# Patient Record
Sex: Female | Born: 1970 | Race: Black or African American | Hispanic: No | Marital: Single | Smoking: Never smoker
Health system: Southern US, Community
[De-identification: ages and names within clinical notes are randomized; demographics above are authoritative.]

## PROBLEM LIST (undated history)

## (undated) DIAGNOSIS — E119 Type 2 diabetes mellitus without complications: Secondary | ICD-10-CM

## (undated) DIAGNOSIS — K219 Gastro-esophageal reflux disease without esophagitis: Secondary | ICD-10-CM

## (undated) DIAGNOSIS — I10 Essential (primary) hypertension: Secondary | ICD-10-CM

## (undated) HISTORY — PX: ABDOMINAL HYSTERECTOMY: SHX81

---

## 2009-03-13 ENCOUNTER — Emergency Department (HOSPITAL_BASED_OUTPATIENT_CLINIC_OR_DEPARTMENT_OTHER): Admission: EM | Admit: 2009-03-13 | Discharge: 2009-03-13 | Payer: Self-pay | Admitting: Emergency Medicine

## 2010-07-01 ENCOUNTER — Emergency Department (HOSPITAL_BASED_OUTPATIENT_CLINIC_OR_DEPARTMENT_OTHER): Admission: EM | Admit: 2010-07-01 | Discharge: 2010-07-01 | Payer: Self-pay | Admitting: Emergency Medicine

## 2011-05-04 ENCOUNTER — Emergency Department (HOSPITAL_BASED_OUTPATIENT_CLINIC_OR_DEPARTMENT_OTHER)
Admission: EM | Admit: 2011-05-04 | Discharge: 2011-05-04 | Disposition: A | Discharge status: Home / Self Care | Payer: 59 | Attending: Emergency Medicine | Admitting: Emergency Medicine

## 2011-05-04 DIAGNOSIS — L259 Unspecified contact dermatitis, unspecified cause: Secondary | ICD-10-CM | POA: Insufficient documentation

## 2011-05-04 DIAGNOSIS — I10 Essential (primary) hypertension: Secondary | ICD-10-CM | POA: Insufficient documentation

## 2011-05-04 LAB — GLUCOSE, CAPILLARY: Glucose-Capillary: 110 mg/dL — ABNORMAL HIGH (ref 70–99)

## 2011-09-22 ENCOUNTER — Emergency Department (INDEPENDENT_AMBULATORY_CARE_PROVIDER_SITE_OTHER): Payer: Worker's Compensation

## 2011-09-22 ENCOUNTER — Emergency Department (HOSPITAL_BASED_OUTPATIENT_CLINIC_OR_DEPARTMENT_OTHER)
Admission: EM | Admit: 2011-09-22 | Discharge: 2011-09-22 | Disposition: A | Discharge status: Home / Self Care | Payer: Worker's Compensation | Attending: Emergency Medicine | Admitting: Emergency Medicine

## 2011-09-22 ENCOUNTER — Encounter: Payer: Self-pay | Admitting: *Deleted

## 2011-09-22 DIAGNOSIS — IMO0002 Reserved for concepts with insufficient information to code with codable children: Secondary | ICD-10-CM | POA: Insufficient documentation

## 2011-09-22 DIAGNOSIS — X58XXXA Exposure to other specified factors, initial encounter: Secondary | ICD-10-CM

## 2011-09-22 DIAGNOSIS — X500XXA Overexertion from strenuous movement or load, initial encounter: Secondary | ICD-10-CM | POA: Insufficient documentation

## 2011-09-22 DIAGNOSIS — Y9289 Other specified places as the place of occurrence of the external cause: Secondary | ICD-10-CM | POA: Insufficient documentation

## 2011-09-22 DIAGNOSIS — S76119A Strain of unspecified quadriceps muscle, fascia and tendon, initial encounter: Secondary | ICD-10-CM

## 2011-09-22 DIAGNOSIS — M25569 Pain in unspecified knee: Secondary | ICD-10-CM

## 2011-09-22 HISTORY — DX: Gastro-esophageal reflux disease without esophagitis: K21.9

## 2011-09-22 MED ORDER — NAPROXEN 250 MG PO TABS
500.0000 mg | ORAL_TABLET | Freq: Once | ORAL | Status: AC
  Administered 2011-09-22: 500 mg via ORAL
  Filled 2011-09-22: qty 2

## 2011-09-22 MED ORDER — NAPROXEN SODIUM 275 MG PO TABS: 275.0000 mg | ORAL_TABLET | Freq: Two times a day (BID) | ORAL | Status: AC | PRN

## 2011-09-22 NOTE — ED Notes (Signed)
C/o left knee pain after twisting it at work

## 2011-09-22 NOTE — ED Notes (Signed)
UDS for work collected.

## 2011-09-22 NOTE — ED Notes (Signed)
rec'd call from pt requesting crutches for additional support due to the weakness in her knees. Spoke to Dr [employer], verbal order rec'd for crutches, pt states she knows how to use them. Crutches placed at front desk for pt to pick up.

## 2011-09-22 NOTE — ED Provider Notes (Signed)
History     CSN: [id number] Arrival date & time: No admission date for patient encounter.  Chief Complaint  Patient presents with  . Knee Pain    (Consider location/radiation/quality/duration/timing/severity/associated sxs/prior treatment) HPI This is a 40 year old black female who twisted her left knee at work this morning. She is now having mild to moderate pain just above the left patella when she ambulates. There is minimal pain at rest. There is no deformity or swelling. She denies other injury. The pain is worst on full extension of the left knee.  Past Medical History  Diagnosis Date  . GERD (gastroesophageal reflux disease)     Past Surgical History  Procedure Date  . Abdominal hysterectomy     History reviewed. No pertinent family history.  History  Substance Use Topics  . Smoking status: Never Smoker   . Smokeless tobacco: Not on file  . Alcohol Use: Yes    OB History    Grav Para Term Preterm Abortions TAB SAB Ect Mult Living                  Review of Systems  All other systems reviewed and are negative.    Allergies  Penicillins  Home Medications   Current Outpatient Rx  Name Route Sig Dispense Refill  . OMEPRAZOLE 10 MG PO CPDR Oral Take 10 mg by mouth daily.        BP 155/81  Pulse 91  Temp(Src) 98.2 F (36.8 C) (Oral)  Resp 18  Ht 5\' 7"  (1.702 m)  Wt 240 lb (108.863 kg)  BMI 37.59 kg/m2  SpO2 98%  Physical Exam General: Well-developed, well-nourished female in no acute distress; appearance consistent with age of record HENT: normocephalic, atraumatic Eyes: Normal appearance Neck: supple Heart: regular rate and rhythm Lungs: Normal respiratory effort and excursion Abdomen: soft; not as Extremities: No deformity; full range of motion; mild tenderness on insertion of left quadriceps tendon on the left patella without swelling or deformity Neurologic: Awake, alert and oriented;motor function intact in all extremities and symmetric;  no facial droop Skin: Warm and dry Psychiatric: Normal mood and affect    ED Course  Procedures (including critical care time)     MDM           Hanley Seamen, MD 09/22/11 [phone]

## 2013-02-17 IMAGING — CR DG KNEE COMPLETE 4+V*L*
4 series · 4 of 4 positions shown · non-contrast
Comparison: None.

CLINICAL DATA: Left knee pain.

LEFT KNEE - COMPLETE 4+ VIEW

[t knee ap left]
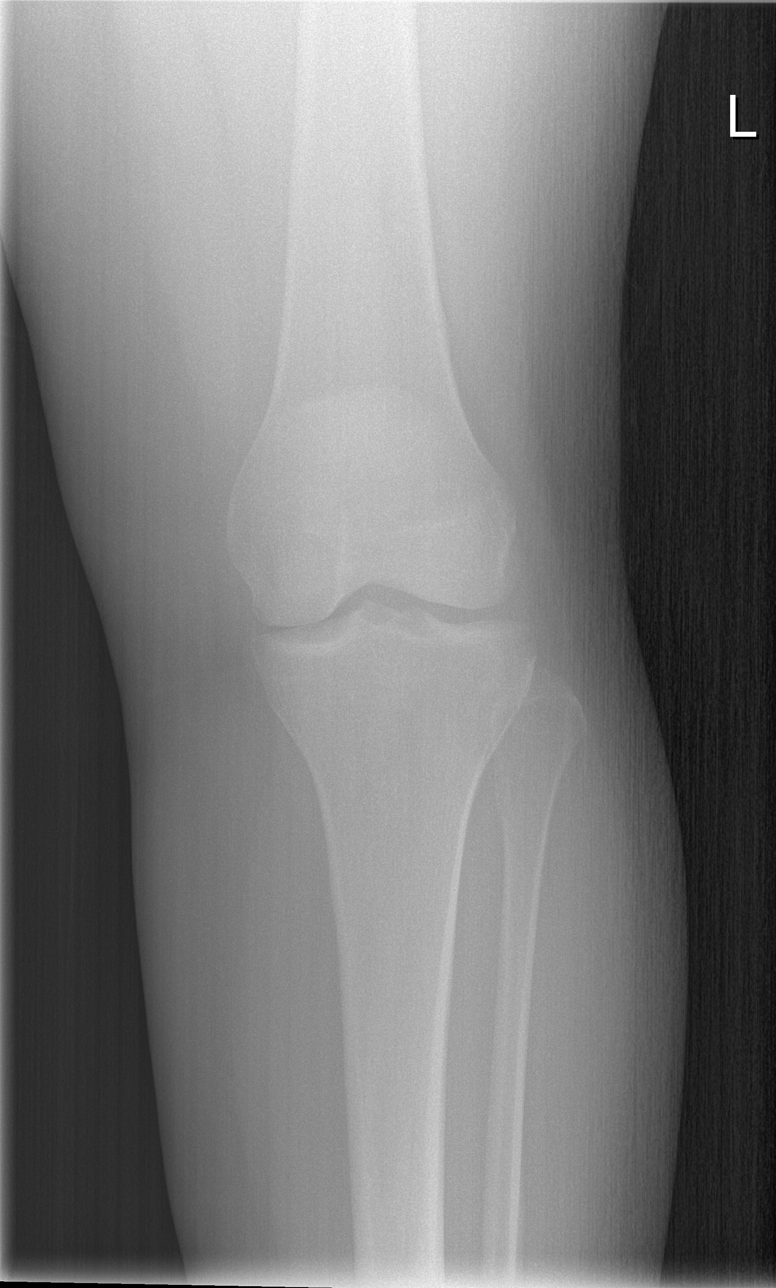

[t knee oblique left (1 of 2)]
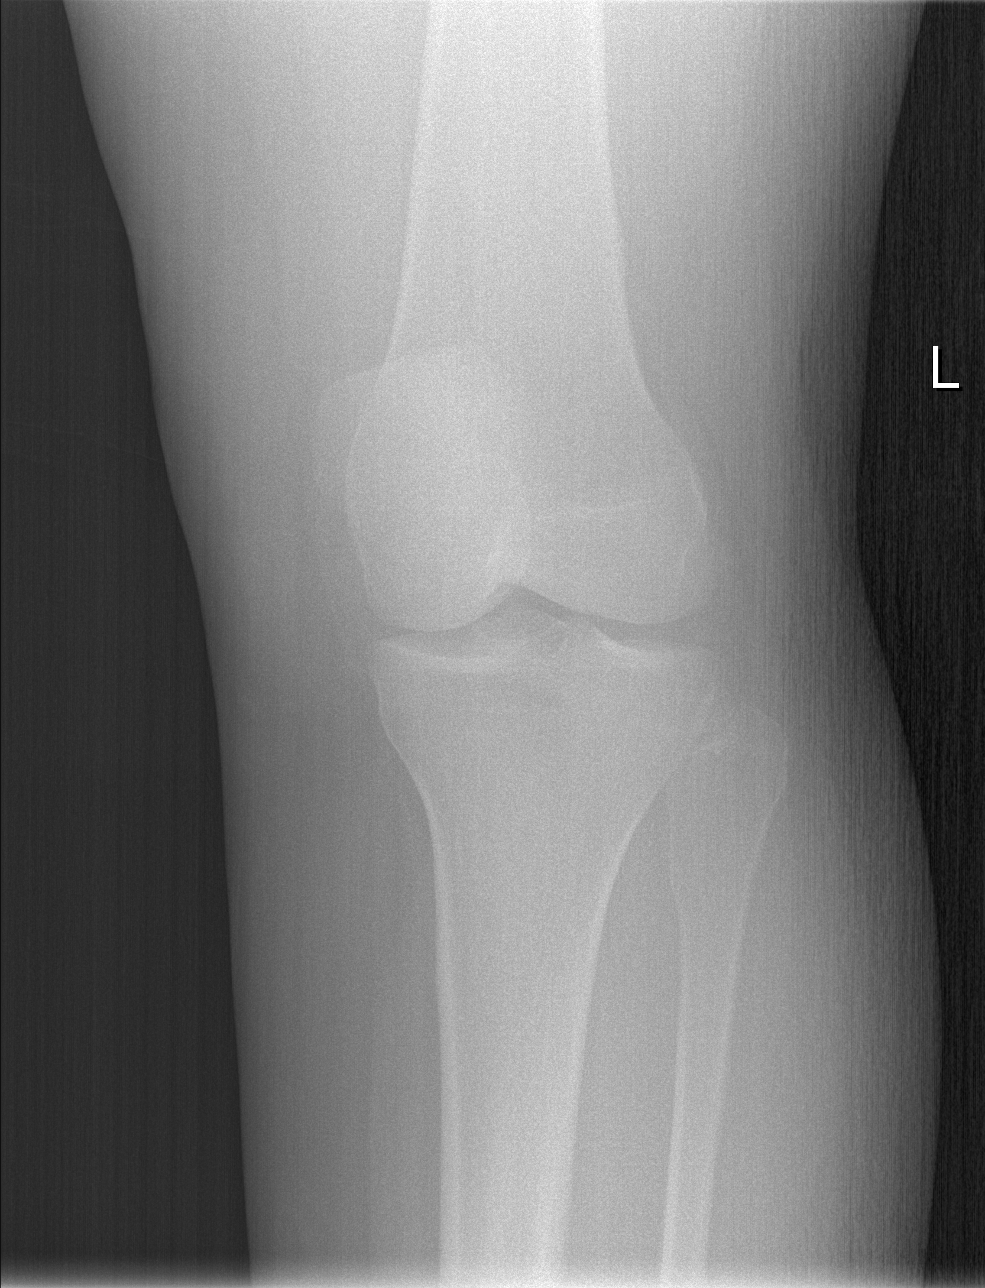

[t knee oblique left (2 of 2)]
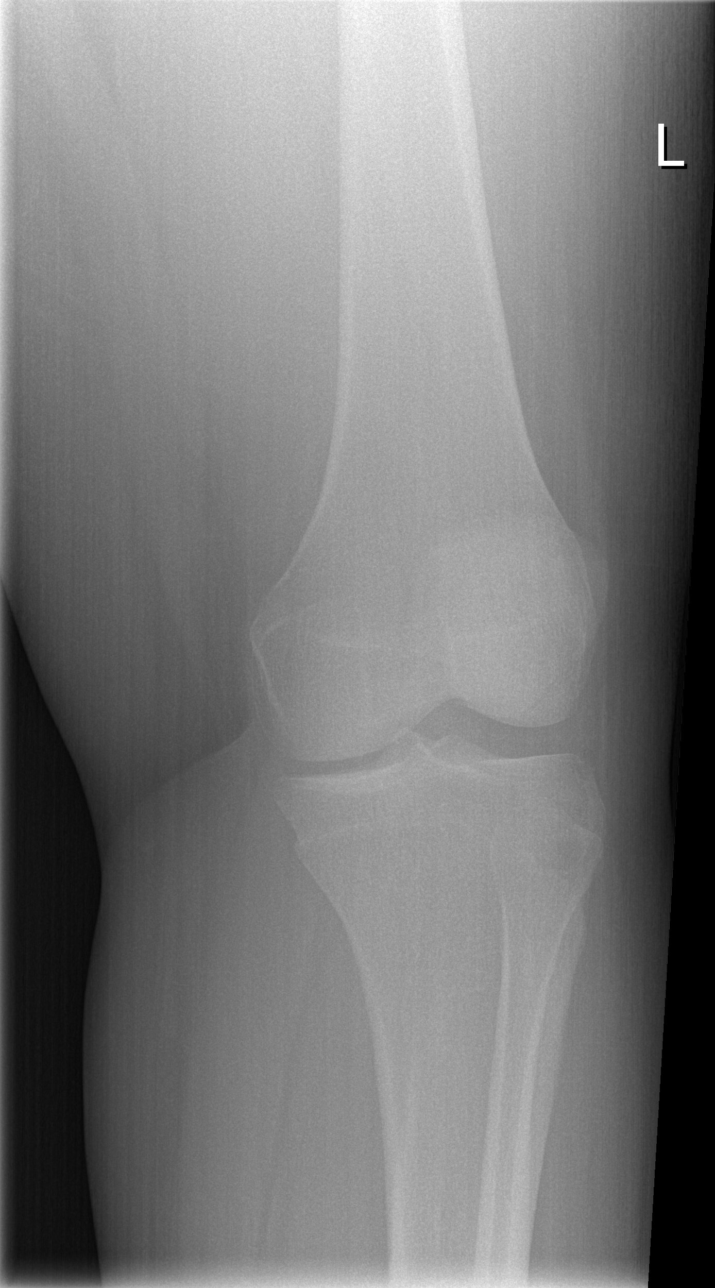

[t knee lat left]
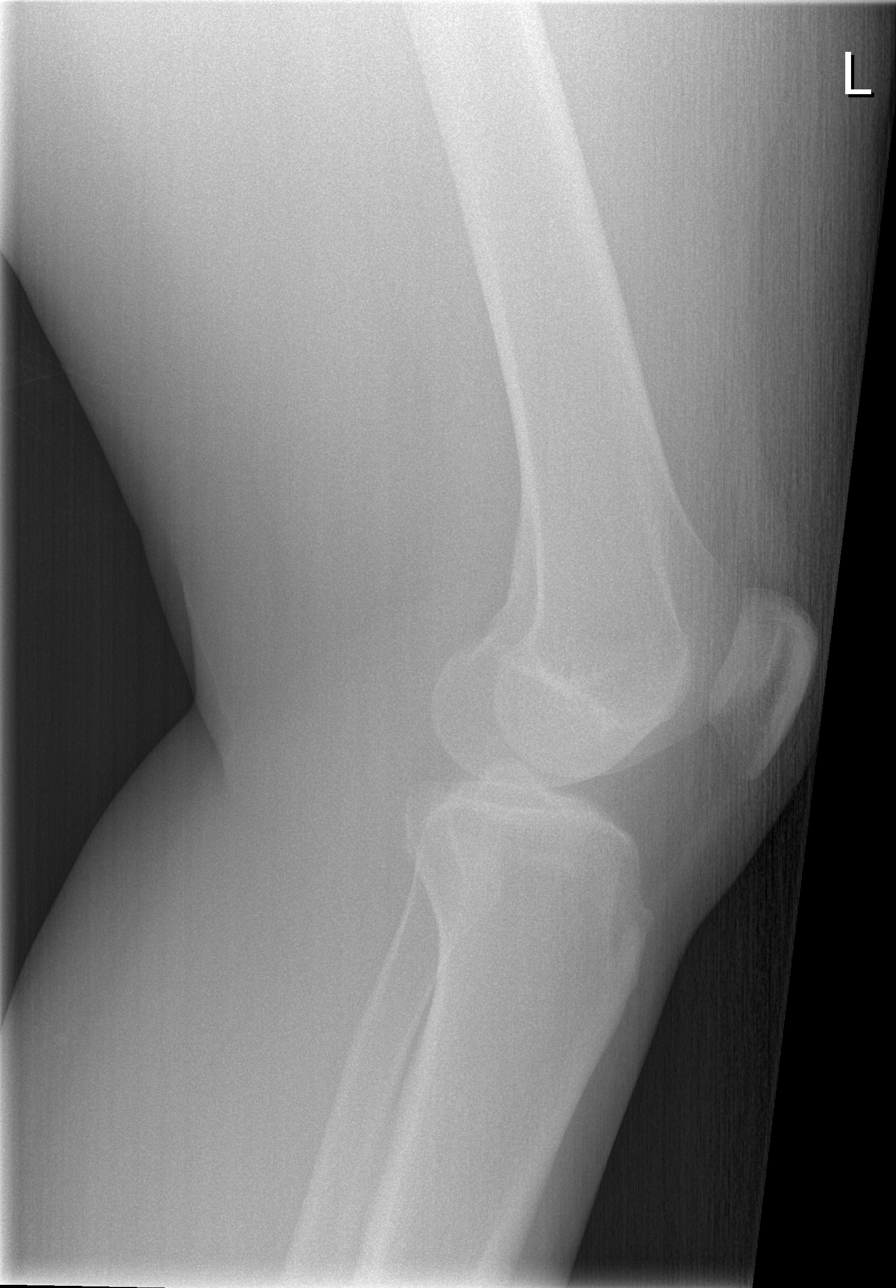

[4 of 4 positions shown; findings below may reference images not displayed]

FINDINGS: No displaced acute fracture or dislocation identified. No
aggressive appearing osseous lesion.
IMPRESSION: No acute osseous abnormality identified. If clinical concern for a
fracture persists, recommend a repeat radiograph in 5-10 days to
evaluate for interval change or callus formation.

## 2017-11-18 ENCOUNTER — Other Ambulatory Visit: Payer: Self-pay | Admitting: Family Medicine

## 2018-02-08 ENCOUNTER — Other Ambulatory Visit: Payer: Self-pay | Admitting: Family Medicine

## 2018-08-21 ENCOUNTER — Other Ambulatory Visit: Payer: Self-pay

## 2018-08-21 ENCOUNTER — Encounter (HOSPITAL_BASED_OUTPATIENT_CLINIC_OR_DEPARTMENT_OTHER): Payer: Self-pay

## 2018-08-21 DIAGNOSIS — R600 Localized edema: Secondary | ICD-10-CM | POA: Insufficient documentation

## 2018-08-21 DIAGNOSIS — E876 Hypokalemia: Secondary | ICD-10-CM | POA: Diagnosis not present

## 2018-08-21 DIAGNOSIS — R2243 Localized swelling, mass and lump, lower limb, bilateral: Secondary | ICD-10-CM | POA: Diagnosis present

## 2018-08-21 DIAGNOSIS — L03115 Cellulitis of right lower limb: Secondary | ICD-10-CM | POA: Insufficient documentation

## 2018-08-21 DIAGNOSIS — I10 Essential (primary) hypertension: Secondary | ICD-10-CM | POA: Insufficient documentation

## 2018-08-21 DIAGNOSIS — L03116 Cellulitis of left lower limb: Secondary | ICD-10-CM | POA: Insufficient documentation

## 2018-08-21 DIAGNOSIS — Z79899 Other long term (current) drug therapy: Secondary | ICD-10-CM | POA: Insufficient documentation

## 2018-08-21 DIAGNOSIS — E119 Type 2 diabetes mellitus without complications: Secondary | ICD-10-CM | POA: Diagnosis not present

## 2018-08-21 DIAGNOSIS — Z7984 Long term (current) use of oral hypoglycemic drugs: Secondary | ICD-10-CM | POA: Insufficient documentation

## 2018-08-21 NOTE — ED Triage Notes (Addendum)
Bilateral lower leg pain since Saturday, with slight redness, painful to touch, pain runs up her shins, also c/o bilateral lower extremity swelling at the end of triage

## 2018-08-22 ENCOUNTER — Ambulatory Visit (HOSPITAL_BASED_OUTPATIENT_CLINIC_OR_DEPARTMENT_OTHER)
Admit: 2018-08-22 | Discharge: 2018-08-22 | Disposition: A | Discharge status: Home / Self Care | Payer: Managed Care, Other (non HMO) | Attending: Emergency Medicine | Admitting: Emergency Medicine

## 2018-08-22 ENCOUNTER — Emergency Department (HOSPITAL_BASED_OUTPATIENT_CLINIC_OR_DEPARTMENT_OTHER)
Admission: EM | Admit: 2018-08-22 | Discharge: 2018-08-22 | Disposition: A | Discharge status: Home / Self Care | Payer: Managed Care, Other (non HMO) | Attending: Emergency Medicine | Admitting: Emergency Medicine

## 2018-08-22 DIAGNOSIS — I82403 Acute embolism and thrombosis of unspecified deep veins of lower extremity, bilateral: Secondary | ICD-10-CM

## 2018-08-22 DIAGNOSIS — E876 Hypokalemia: Secondary | ICD-10-CM

## 2018-08-22 DIAGNOSIS — L03119 Cellulitis of unspecified part of limb: Secondary | ICD-10-CM

## 2018-08-22 DIAGNOSIS — R600 Localized edema: Secondary | ICD-10-CM

## 2018-08-22 HISTORY — DX: Type 2 diabetes mellitus without complications: E11.9

## 2018-08-22 HISTORY — DX: Essential (primary) hypertension: I10

## 2018-08-22 LAB — BASIC METABOLIC PANEL
Anion gap: 9 (ref 5–15)
BUN: 15 mg/dL (ref 6–20)
CO2: 28 mmol/L (ref 22–32)
CREATININE: 0.78 mg/dL (ref 0.44–1.00)
Calcium: 8.9 mg/dL (ref 8.9–10.3)
Chloride: 99 mmol/L (ref 98–111)
GFR calc Af Amer: >60 mL/min (ref >60)
GLUCOSE: 90 mg/dL (ref 70–99)
Potassium: 3 mmol/L — ABNORMAL LOW (ref 3.5–5.1)
Sodium: 136 mmol/L (ref 135–145)

## 2018-08-22 LAB — CBC WITH DIFFERENTIAL/PLATELET
BASOS PCT: 0 %
Basophils Absolute: 0 10*3/uL (ref 0.0–0.1)
EOS ABS: 0.1 10*3/uL (ref 0.0–0.7)
EOS PCT: 1 %
HCT: 36.7 % (ref 36.0–46.0)
Hemoglobin: 12.5 g/dL (ref 12.0–15.0)
Lymphocytes Relative: 56 %
Lymphs Abs: 2.6 10*3/uL (ref 0.7–4.0)
MCH: 28.6 pg (ref 26.0–34.0)
MCHC: 34.1 g/dL (ref 30.0–36.0)
MCV: 84 fL (ref 78.0–100.0)
MONOS PCT: 12 %
Monocytes Absolute: 0.6 10*3/uL (ref 0.1–1.0)
NEUTROS PCT: 31 %
Neutro Abs: 1.5 10*3/uL (ref 1.7–7.7)
PLATELETS: 195 10*3/uL (ref 150–400)
RBC: 4.37 MIL/uL (ref 3.87–5.11)
RDW: 14 % (ref 11.5–15.5)
WBC: 4.8 10*3/uL (ref 4.0–10.5)

## 2018-08-22 MED ORDER — POTASSIUM CHLORIDE CRYS ER 20 MEQ PO TBCR: 40.0000 meq | EXTENDED_RELEASE_TABLET | Freq: Every day | ORAL | 0 refills | Status: DC

## 2018-08-22 MED ORDER — DOXYCYCLINE HYCLATE 100 MG PO CAPS: 100.0000 mg | ORAL_CAPSULE | Freq: Two times a day (BID) | ORAL | 0 refills | Status: AC

## 2018-08-22 MED ORDER — POTASSIUM CHLORIDE CRYS ER 20 MEQ PO TBCR: 40.0000 meq | EXTENDED_RELEASE_TABLET | Freq: Every day | ORAL | 0 refills | Status: AC

## 2018-08-22 MED ORDER — POTASSIUM CHLORIDE CRYS ER 20 MEQ PO TBCR
40.0000 meq | EXTENDED_RELEASE_TABLET | Freq: Once | ORAL | Status: AC
  Administered 2018-08-22: 40 meq via ORAL
  Filled 2018-08-22: qty 2

## 2018-08-22 NOTE — Discharge Instructions (Addendum)
You were seen today for lower extremity swelling.  You may have some mild cellulitis.  Take antibiotics as prescribed.  Your potassium was noted to be low.  Take supplementation and follow-up with your primary doctor for recheck.  Return later today for ultrasound.  In the meantime make sure to keep legs elevated.

## 2018-08-22 NOTE — ED Provider Notes (Signed)
Patient presented to the radiology department today for DVT study as an extension of their workup for left knee pain last night. Please see previous provider's note for details of that visit to include history, physical and medical decision making.   I only relayed the results of the study to them which showed a bakers cyst but negative for DVT.   Also discussed with them reasons to follow up at the emergency department otherwise continue following up with her primary doctor as directed by previous provider.    Marily Memos, MD 08/23/18 2036

## 2018-08-22 NOTE — ED Provider Notes (Signed)
[facility] EMERGENCY DEPARTMENT Provider Note   CSN: [id number] Arrival date & time: 08/21/18  2251     History   Chief Complaint Chief Complaint  Patient presents with  . Leg Pain    HPI Jasmine Mathews is a 47 y.o. female.  HPI  This is a 46 year old female with history of diabetes and hypertension who presents with 2 to 3-day history of bilateral lower extremity pain.  Patient states that she noted some swelling and mild redness on Saturday.  She reports is painful to touch.  Rates her pain at 5 out of 10.  She has not taken anything for pain.  Notes swelling is worse at the end of the day.  Has not noted any asymmetric swelling.  No history of blood clots, recent hospitalization, recent long travel.  Patient does not wear compression stockings or take diuretics.  Denies any chest pain, shortness of breath, abdominal pain, nausea, vomiting.  No recent changes in medications.  She denies fevers.  Past Medical History:  Diagnosis Date  . Diabetes mellitus without complication (HCC)   . GERD (gastroesophageal reflux disease)   . Hypertension     There are no active problems to display for this patient.   Past Surgical History:  Procedure Laterality Date  . ABDOMINAL HYSTERECTOMY       OB History   None      Home Medications    Prior to Admission medications   Medication Sig Start Date End Date Taking? Authorizing Provider  enalapril (VASOTEC) 2.5 MG tablet Take 2.5 mg by mouth daily.   Yes [provider]  hydrochlorothiazide (MICROZIDE) 12.5 MG capsule Take 12.5 mg by mouth daily.   Yes [provider]  metFORMIN (GLUCOPHAGE) 500 MG tablet Take by mouth 2 (two) times daily with a meal.   Yes [provider]  Progesterone Micronized (PROGESTERONE PO) Take by mouth.   Yes [provider]  doxycycline (VIBRAMYCIN) 100 MG capsule Take 1 capsule (100 mg total) by mouth 2 (two) times daily. 08/22/18   Horton, Mayer Masker, MD    omeprazole (PRILOSEC) 20 MG capsule TAKE 1 CAPSULE ONCE DAILY AS NEEDED FOR REFLUX SYMPTOMS 02/08/18   Mliss Sax, MD  potassium chloride SA (K-DUR,KLOR-CON) 20 MEQ tablet Take 2 tablets (40 mEq total) by mouth daily. 08/22/18   Horton, Mayer Masker, MD    Family History No family history on file.  Social History Social History   Tobacco Use  . Smoking status: Never Smoker  Substance Use Topics  . Alcohol use: Yes  . Drug use: No     Allergies   Penicillins   Review of Systems Review of Systems  Constitutional: Negative for fever.  Respiratory: Negative for shortness of breath.   Cardiovascular: Positive for leg swelling. Negative for chest pain.  Gastrointestinal: Negative for abdominal pain, nausea and vomiting.  Genitourinary: Negative for dysuria.  Skin: Positive for color change.  All other systems reviewed and are negative.    Physical Exam Updated Vital Signs BP 138/81 (BP Location: Left Arm)   Pulse 65   Temp 97.8 F (36.6 C) (Oral)   Resp 18   Ht 1.702 m (5\' 7" )   Wt 108.9 kg   SpO2 100%   BMI 37.59 kg/m   Physical Exam  Constitutional: She is oriented to person, place, and time. She appears well-developed and well-nourished. No distress.  Overweight  HENT:  Head: Normocephalic and atraumatic.  Eyes: Pupils are equal, round, and  reactive to light.  Neck: Neck supple.  Cardiovascular: Normal rate, regular rhythm and normal heart sounds.  Pulmonary/Chest: Effort normal and breath sounds normal. No respiratory distress. She has no wheezes.  Abdominal: Soft. Bowel sounds are normal. There is no tenderness.  Musculoskeletal: She exhibits edema.  Trace to 1+ bilateral lower extremity edema, slight anterior discoloration and erythema, slight warmth, no calf tenderness to palpation  Neurological: She is alert and oriented to person, place, and time.  Skin: Skin is warm and dry.  Psychiatric: She has a normal mood and affect.  Nursing note and  vitals reviewed.    ED Treatments / Results  Labs (all labs ordered are listed, but only abnormal results are displayed) Labs Reviewed  BASIC METABOLIC PANEL - Abnormal; Notable for the following components:      Result Value   Potassium 3.0 (*)    All other components within normal limits  CBC WITH DIFFERENTIAL/PLATELET    EKG None  Radiology No results found.  Procedures Procedures (including critical care time)  Medications Ordered in ED Medications  potassium chloride SA (K-DUR,KLOR-CON) CR tablet 40 mEq (has no administration in time range)     Initial Impression / Assessment and Plan / ED Course  I have reviewed the triage vital signs and the nursing notes.  Pertinent labs & imaging results that were available during my care of the patient were reviewed by me and considered in my medical decision making (see chart for details).     She presents with lower extremity swelling.  Overall nontoxic-appearing vital signs reassuring.  Reports she has been afebrile.  Symmetric swelling on exam.  She does have some slight erythema and warmth.  Likely some early cellulitis.  No leukocytosis.  She does have some hypokalemia at 3.0.  This would preclude initiation of any diuretic.  Patient is also concerned about blood clot.  I am less suspicious for this given symmetry of swelling.  However, will have patient return for ultrasound later today.  In the meantime will supplement with potassium and start on doxycycline.  I have encouraged her to keep her legs elevated.  Follow-up with primary doctor in 1 week for recheck potassium.  That time she may need to be assessed for diuretics.  After history, exam, and medical workup I feel the patient has been appropriately medically screened and is safe for discharge home. Pertinent diagnoses were discussed with the patient. Patient was given return precautions.   Final Clinical Impressions(s) / ED Diagnoses   Final diagnoses:  Lower  extremity edema  Cellulitis of lower extremity, unspecified laterality  Hypokalemia    ED Discharge Orders         Ordered    doxycycline (VIBRAMYCIN) 100 MG capsule  2 times daily     08/22/18 0210    potassium chloride SA (K-DUR,KLOR-CON) 20 MEQ tablet  Daily     08/22/18 0210    US Venous Img Lower Bilateral     08/22/18 0210           Shon Baton, MD 08/22/18 [phone]

## 2018-12-15 IMAGING — US US EXTREM LOW VENOUS BILAT
1 series · 13 of 24 positions shown · non-contrast
Comparison: None.

CLINICAL DATA: 46-year-old female with bilateral lower extremity
pain, swelling and redness



[Series 1: us extrem low venous bilat · 0.10mm/px · 13 of 66 slices shown]
[im 1/66]
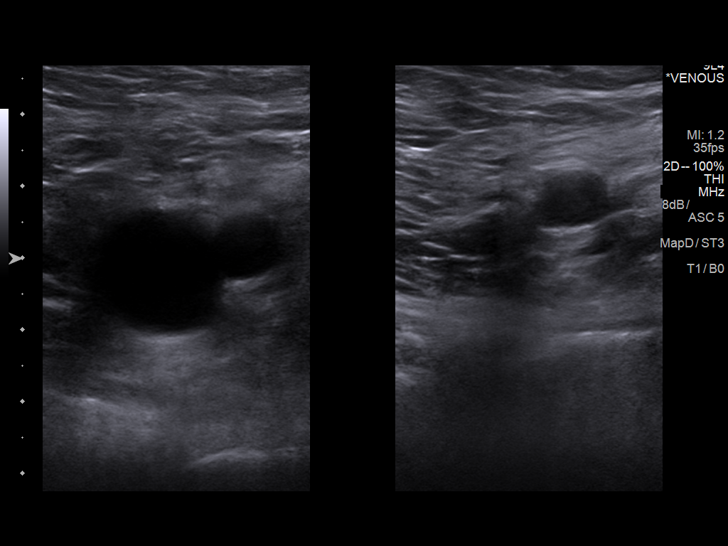
[im 6/66]
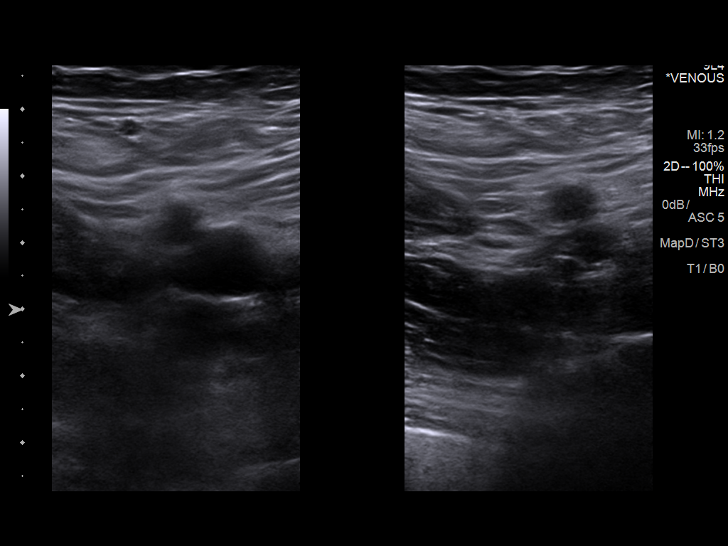
[im 12/66]
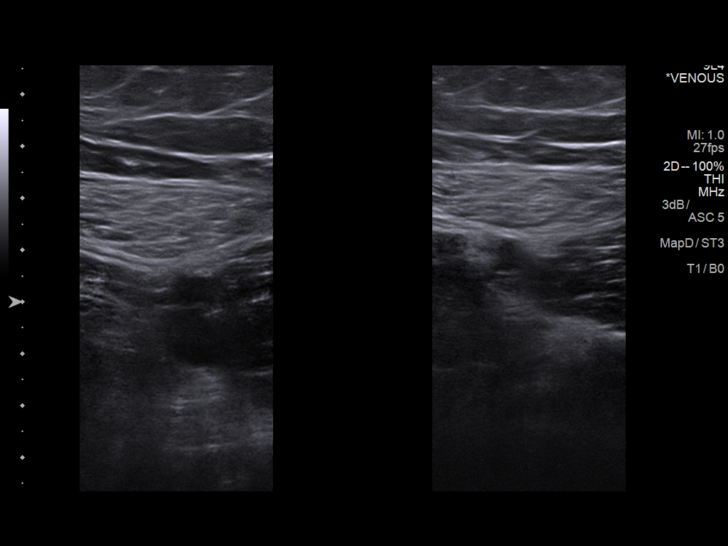
[im 17/66]
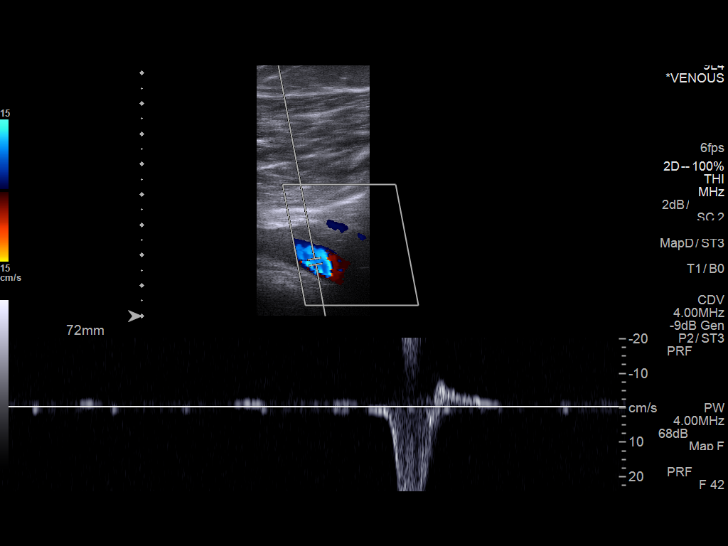
[im 23/66]
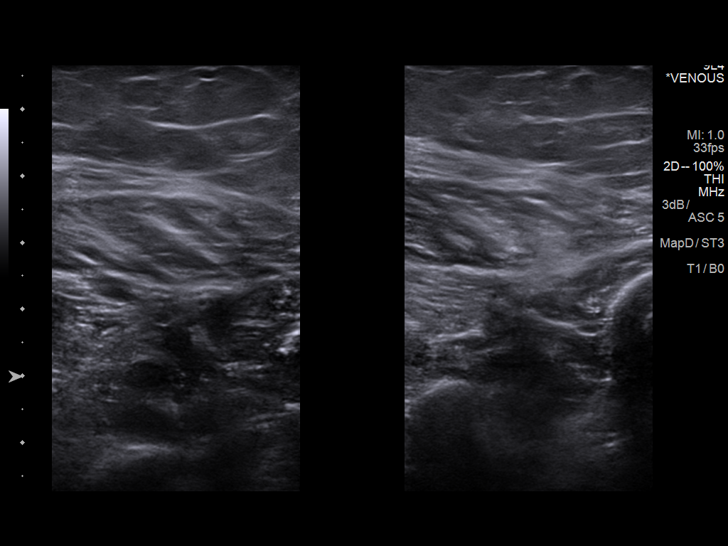
[im 29/66]
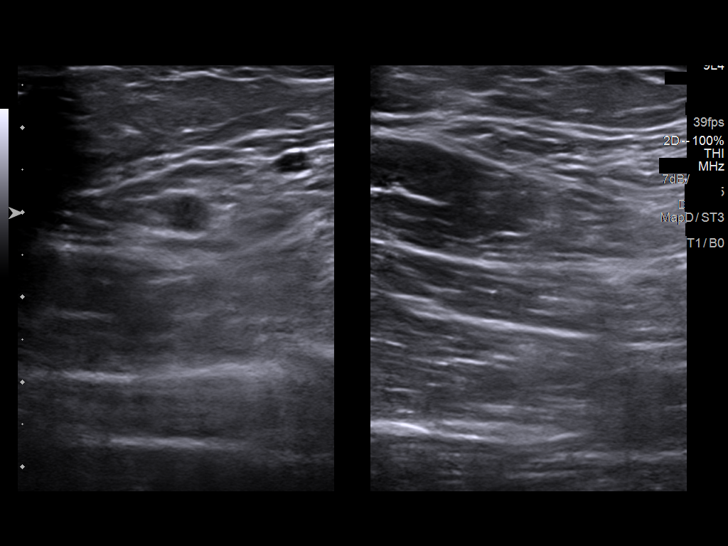
[im 34/66]
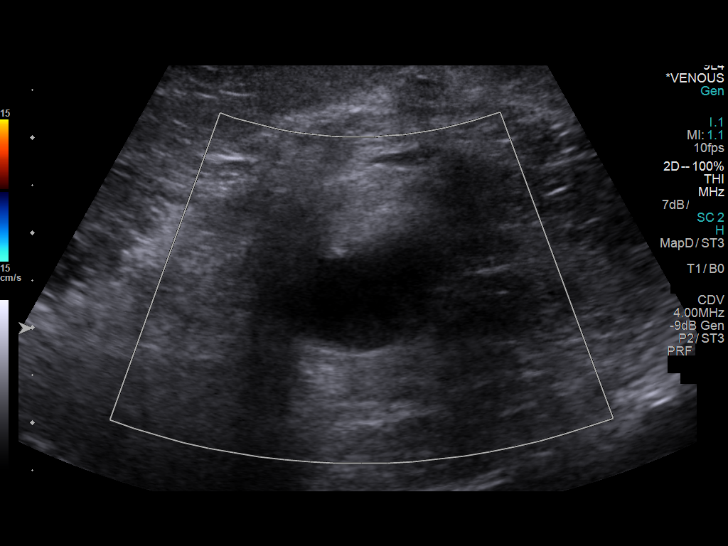
[im 37/66]
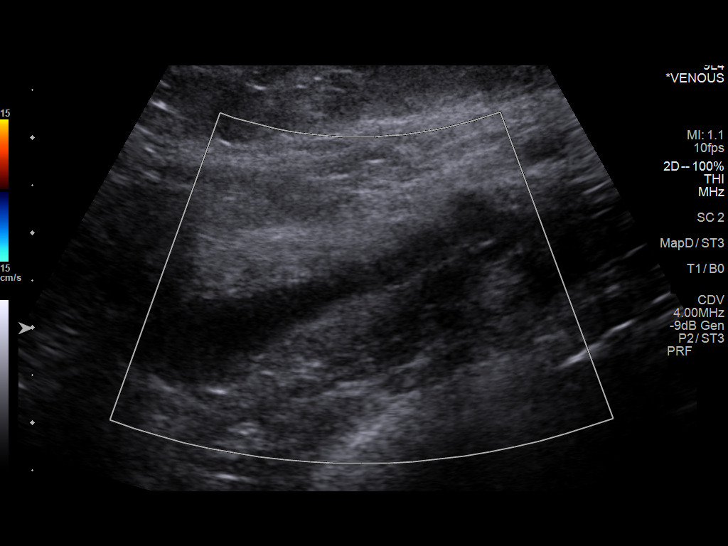
[im 43/66]
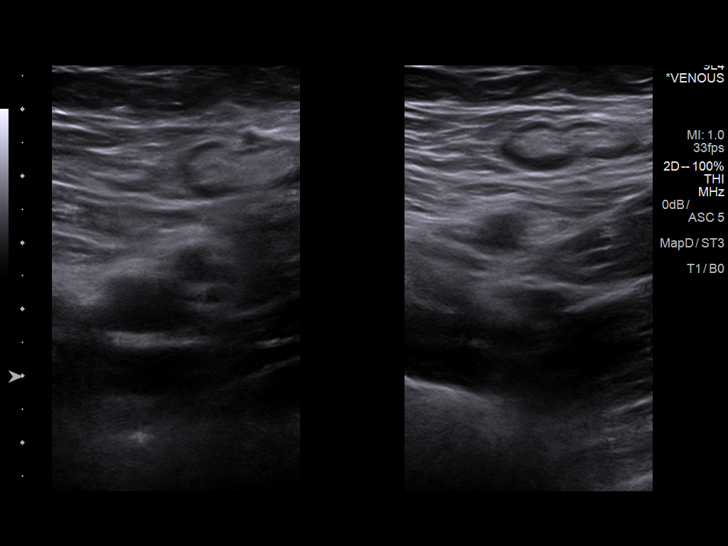
[im 49/66]
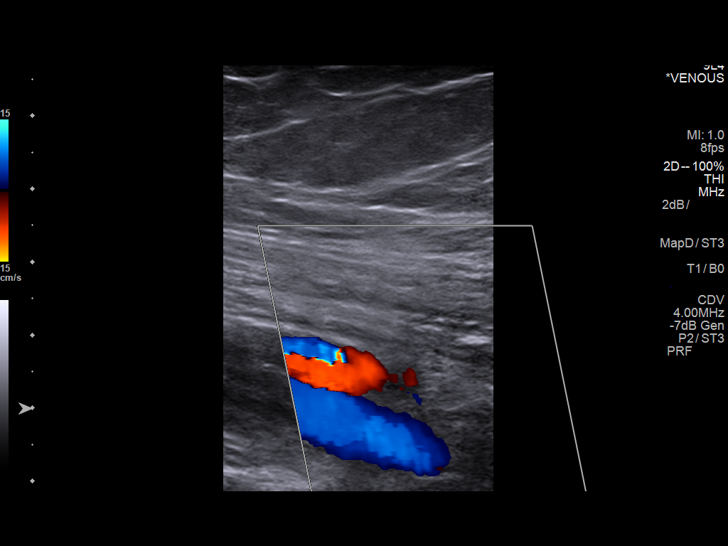
[im 54/66]
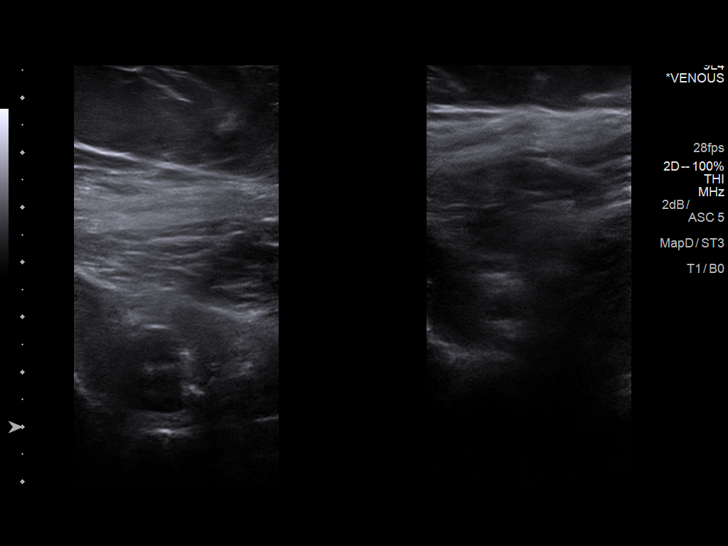
[im 60/66]
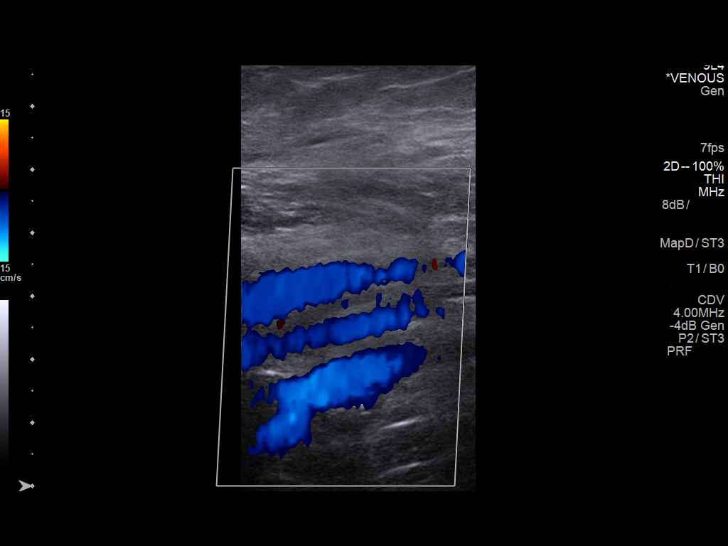
[im 66/66]
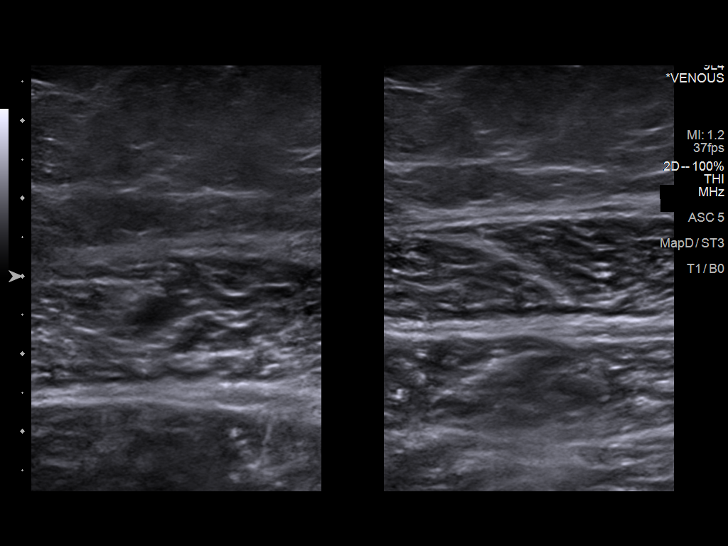

[13 of 24 positions shown; findings below may reference images not displayed]

FINDINGS: RIGHT LOWER EXTREMITY

Common Femoral Vein: No evidence of thrombus. Normal
compressibility, respiratory phasicity and response to augmentation.

Saphenofemoral Junction: No evidence of thrombus. Normal
compressibility and flow on color Doppler imaging.

Profunda Femoral Vein: No evidence of thrombus. Normal
compressibility and flow on color Doppler imaging.

Femoral Vein: No evidence of thrombus. Normal compressibility,
respiratory phasicity and response to augmentation.

Popliteal Vein: No evidence of thrombus. Normal compressibility,
respiratory phasicity and response to augmentation.

Calf Veins: No evidence of thrombus. Normal compressibility and flow
on color Doppler imaging.

Superficial Great Saphenous Vein: No evidence of thrombus. Normal
compressibility.

Venous Reflux:  None.

Other Findings:  None.

LEFT LOWER EXTREMITY

Common Femoral Vein: No evidence of thrombus. Normal
compressibility, respiratory phasicity and response to augmentation.

Saphenofemoral Junction: No evidence of thrombus. Normal
compressibility and flow on color Doppler imaging.

Profunda Femoral Vein: No evidence of thrombus. Normal
compressibility and flow on color Doppler imaging.

Femoral Vein: No evidence of thrombus. Normal compressibility,
respiratory phasicity and response to augmentation.

Popliteal Vein: No evidence of thrombus. Normal compressibility,
respiratory phasicity and response to augmentation.

Calf Veins: No evidence of thrombus. Normal compressibility and flow
on color Doppler imaging.

Superficial Great Saphenous Vein: No evidence of thrombus. Normal
compressibility.

Venous Reflux:  None.

Other Findings: Hypoechoic fluid collection in the popliteal fossa
measures 3.8 x 0.7 x 1.6 cm.
IMPRESSION: 1. No evidence of deep or superficial venous thrombosis in either
lower extremity.
2. Small left-sided Baker's cyst.

## 2024-08-24 ENCOUNTER — Encounter (HOSPITAL_BASED_OUTPATIENT_CLINIC_OR_DEPARTMENT_OTHER): Payer: Self-pay

## 2024-08-24 ENCOUNTER — Emergency Department (HOSPITAL_BASED_OUTPATIENT_CLINIC_OR_DEPARTMENT_OTHER): Payer: Worker's Compensation

## 2024-08-24 ENCOUNTER — Emergency Department (HOSPITAL_BASED_OUTPATIENT_CLINIC_OR_DEPARTMENT_OTHER)
Admission: EM | Admit: 2024-08-24 | Discharge: 2024-08-24 | Disposition: A | Discharge status: Home / Self Care | Payer: Worker's Compensation | Attending: Emergency Medicine | Admitting: Emergency Medicine

## 2024-08-24 ENCOUNTER — Other Ambulatory Visit: Payer: Self-pay

## 2024-08-24 DIAGNOSIS — S93411A Sprain of calcaneofibular ligament of right ankle, initial encounter: Secondary | ICD-10-CM | POA: Diagnosis not present

## 2024-08-24 DIAGNOSIS — X501XXA Overexertion from prolonged static or awkward postures, initial encounter: Secondary | ICD-10-CM | POA: Diagnosis not present

## 2024-08-24 DIAGNOSIS — Y990 Civilian activity done for income or pay: Secondary | ICD-10-CM | POA: Insufficient documentation

## 2024-08-24 DIAGNOSIS — S99911A Unspecified injury of right ankle, initial encounter: Secondary | ICD-10-CM | POA: Diagnosis present

## 2024-08-24 MED ORDER — NAPROXEN 250 MG PO TABS
500.0000 mg | ORAL_TABLET | Freq: Once | ORAL | Status: AC
  Administered 2024-08-24: 500 mg via ORAL
  Filled 2024-08-24: qty 2

## 2024-08-24 MED ORDER — NAPROXEN 500 MG PO TABS: 500.0000 mg | ORAL_TABLET | Freq: Two times a day (BID) | ORAL | 0 refills | Status: AC

## 2024-08-24 MED ORDER — HYDROCODONE-ACETAMINOPHEN 5-325 MG PO TABS: 1.0000 | ORAL_TABLET | ORAL | 0 refills | Status: AC | PRN

## 2024-08-24 NOTE — Discharge Instructions (Signed)
 You were given a prescription for Norco, please take this medication every 4 hours for severe pain.  In addition, you were given a prescription for naproxen , please take 1 tablet twice a day for the next 7 days in order to help with swelling and inflammation.  You were given the phone number to orthopedics, please schedule an appointment at your earliest convenience.

## 2024-08-24 NOTE — ED Triage Notes (Signed)
 Pt reports twisting R ankle around noon today. Pt denies any fall or injury. Pt ambulatory with crutches in triage.

## 2024-08-24 NOTE — ED Provider Notes (Signed)
 [place] EMERGENCY DEPARTMENT AT [facility] Provider Note   CSN: [id number] Arrival date & time: 08/24/24  1721     Patient presents with: Ankle Pain   Jasmine Mathews is a 53 y.o. female.   53 y.o female with a PMH of DM,HTN presents to the ED with a chief complaint of right ankle injury.  Patient reports she was at her desk at work when suddenly she stood up from her chair and went to walk to the printer was suddenly in her ankle and inverted.  She is reporting a lot of pain along the lateral aspect exacerbated with any weightbearing along with any plantarflexion.  She has not taken any medication for improvement in symptoms.  She is currently ambulating with crutches which does help relieve some of the pressure from the right foot.  She denies any other injury or complaints.  The history is provided by the patient.  Ankle Pain Associated symptoms: no fever        Prior to Admission medications   Medication Sig Start Date End Date Taking? Authorizing Provider  HYDROcodone -acetaminophen  (NORCO/VICODIN) 5-325 MG tablet Take 1 tablet by mouth every 4 (four) hours as needed for up to 3 days. 08/24/24 08/27/24 Yes Tami Barren, PA-C  naproxen  (NAPROSYN ) 500 MG tablet Take 1 tablet (500 mg total) by mouth 2 (two) times daily for 7 days. 08/24/24 08/31/24 Yes Jettie Mannor, PA-C  doxycycline  (VIBRAMYCIN ) 100 MG capsule Take 1 capsule (100 mg total) by mouth 2 (two) times daily. 08/22/18   Horton, Charmaine FALCON, MD  enalapril (VASOTEC) 2.5 MG tablet Take 2.5 mg by mouth daily.    [provider]  hydrochlorothiazide (MICROZIDE) 12.5 MG capsule Take 12.5 mg by mouth daily.    [provider]  metFORMIN (GLUCOPHAGE) 500 MG tablet Take by mouth 2 (two) times daily with a meal.    [provider]  omeprazole (PRILOSEC) 20 MG capsule TAKE 1 CAPSULE ONCE DAILY AS NEEDED FOR REFLUX SYMPTOMS 02/08/18   Berneta Elsie Sayre, MD  potassium chloride  SA (K-DUR,KLOR-CON ) 20  MEQ tablet Take 2 tablets (40 mEq total) by mouth daily. 08/22/18   Horton, Charmaine FALCON, MD  Progesterone Micronized (PROGESTERONE PO) Take by mouth.    [provider]    Allergies: Penicillins and Clindamycin/lincomycin    Review of Systems  Constitutional:  Negative for chills and fever.  Respiratory:  Negative for shortness of breath.   Musculoskeletal:  Positive for arthralgias.    Updated Vital Signs BP (!) 188/92 (BP Location: Right Arm)   Pulse 66   Temp 97.9 F (36.6 C) (Temporal)   Resp 18   Ht 5' 7 (1.702 m)   Wt 122 kg   SpO2 99%   BMI 42.13 kg/m   Physical Exam Vitals and nursing note reviewed.  Constitutional:      Appearance: Normal appearance.  HENT:     Head: Normocephalic and atraumatic.     Mouth/Throat:     Mouth: Mucous membranes are moist.  Cardiovascular:     Rate and Rhythm: Normal rate.     Pulses:          Dorsalis pedis pulses are 2+ on the right side.  Pulmonary:     Effort: Pulmonary effort is normal.  Abdominal:     General: Abdomen is flat.  Musculoskeletal:     Cervical back: Normal range of motion and neck supple.     Right ankle: Swelling present. No deformity. Tenderness present over the  lateral malleolus. Decreased range of motion.  Skin:    General: Skin is warm and dry.  Neurological:     Mental Status: She is alert and oriented to person, place, and time.     (all labs ordered are listed, but only abnormal results are displayed) Labs Reviewed - No data to display  EKG: None  Radiology: DG Ankle Right Port Result Date: 08/24/2024 CLINICAL DATA:  [id number] Sprain [id number] EXAM: PORTABLE RIGHT ANKLE - 2 VIEW COMPARISON:  None Available. FINDINGS: No acute fracture or dislocation. No ankle mortise widening. The talar dome is intact. There is no evidence of arthropathy or other focal bone abnormality. Moderate soft tissue swelling about the ankle. IMPRESSION: Moderate soft tissue swelling about the ankle. No acute fracture  or dislocation. Electronically Signed   By: Rogelia Myers M.D.   On: 08/24/2024 18:21     Procedures   Medications Ordered in the ED  naproxen  (NAPROSYN ) tablet 500 mg (has no administration in time range)                                    Medical Decision Making Amount and/or Complexity of Data Reviewed Radiology: ordered.    Patient presented to the ED status post right ankle injury as she was getting out of her chair at work and her ankle inverted.  She is having a lot of pain along the lateral aspect/related with any weightbearing.  She did come in and crutches.  On evaluation she is neurovascularly intact, sensation is intact.  There is significant swelling noted to the lateral aspect.  X-ray obtained in triage does not show any fracture or dislocation, however does show some extensive soft tissue swelling.  I discussed results with patient at length, she will be placed on a ankle brace.  We will also send her home with a short prescription of Norco, to help for severe pain.  Naproxen  to help with inflammation along with RICE therapy.  She is agreeable to plan and treatment, return precautions discussed at length,.  Hemodynamically stable for discharge.   Portions of this note were generated with Scientist, clinical (histocompatibility and immunogenetics). Dictation errors may occur despite best attempts at proofreading.   Final diagnoses:  Sprain of calcaneofibular ligament of right ankle, initial encounter    ED Discharge Orders          Ordered    HYDROcodone -acetaminophen  (NORCO/VICODIN) 5-325 MG tablet  Every 4 hours PRN        08/24/24 1832    naproxen  (NAPROSYN ) 500 MG tablet  2 times daily        08/24/24 1835               Danika Kluender, PA-C 08/24/24 1841    Yolande Lamar BROCKS, MD 08/26/24 [phone]
